# Patient Record
Sex: Female | Born: 1985 | Race: White | Hispanic: No | Marital: Single | Smoking: Never smoker
Health system: Southern US, Community
[De-identification: ages and names within clinical notes are randomized; demographics above are authoritative.]

## PROBLEM LIST (undated history)

## (undated) DIAGNOSIS — R87629 Unspecified abnormal cytological findings in specimens from vagina: Secondary | ICD-10-CM

## (undated) HISTORY — DX: Unspecified abnormal cytological findings in specimens from vagina: R87.629

---
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
## 2006-05-04 ENCOUNTER — Ambulatory Visit (HOSPITAL_COMMUNITY): Admission: RE | Admit: 2006-05-04 | Discharge: 2006-05-04 | Payer: Self-pay | Admitting: Obstetrics and Gynecology

## 2006-05-30 ENCOUNTER — Inpatient Hospital Stay (HOSPITAL_COMMUNITY): Admission: AD | Admit: 2006-05-30 | Discharge: 2006-06-01 | Payer: Self-pay | Admitting: Obstetrics and Gynecology

## 2006-07-11 ENCOUNTER — Other Ambulatory Visit: Admission: RE | Admit: 2006-07-11 | Discharge: 2006-07-11 | Payer: Self-pay | Admitting: Obstetrics and Gynecology

## 2008-07-26 ENCOUNTER — Emergency Department: Payer: Self-pay | Admitting: Emergency Medicine
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
## 2013-01-11 ENCOUNTER — Encounter: Payer: Self-pay | Admitting: Obstetrics and Gynecology

## 2013-01-21 ENCOUNTER — Encounter: Payer: Self-pay | Admitting: *Deleted

## 2016-02-02 ENCOUNTER — Encounter (HOSPITAL_COMMUNITY): Payer: Self-pay | Admitting: *Deleted

## 2016-02-11 ENCOUNTER — Ambulatory Visit (HOSPITAL_COMMUNITY)
Admission: RE | Admit: 2016-02-11 | Discharge: 2016-02-11 | Disposition: A | Discharge status: Home / Self Care | Payer: Medicaid Other | Source: Ambulatory Visit | Attending: Obstetrics and Gynecology | Admitting: Obstetrics and Gynecology

## 2016-02-11 ENCOUNTER — Encounter (HOSPITAL_COMMUNITY): Payer: Self-pay

## 2016-02-11 VITALS — BP 112/60 | Temp 98.6°F | Ht 66.0 in | Wt 146.8 lb

## 2016-02-11 DIAGNOSIS — R87613 High grade squamous intraepithelial lesion on cytologic smear of cervix (HGSIL): Secondary | ICD-10-CM | POA: Diagnosis present

## 2016-02-11 DIAGNOSIS — Z1239 Encounter for other screening for malignant neoplasm of breast: Secondary | ICD-10-CM

## 2016-02-11 NOTE — Progress Notes (Signed)
[facility] due to recommending a colposcopy for an abnormal Pap smear on 12/30/2015.  Pap Smear: Pap smear not completed today. [facility]. [facility] for a [facility]ps[facility]. Appointment scheduled for Thursday, February 18, 2016 at 1420. Per patient has a history of three or four other abnormal Pap smears that did not have follow up completed. Last Pap smear result is in [facility].  Physical exam: Breasts Breasts symmetrical. No skin abnormalities bilateral breasts. No nipple retraction bilateral breasts. No nipple discharge bilateral breasts. No lymphadenopathy. No lumps palpated bilateral breasts. No complaints of pain or tenderness on exam. Screening mammogram recommended at age 940 unless clinically indicated prior.    Pelvic/Bimanual No Pap smear completed today since last Pap smear was 12/30/2015. Pap smear not indicated per [facility] guidelines.   Smoking History: Patient has never smoked.  Patient Navigation: Patient education provided. Access to services provided for [facility] through [facility].

## 2016-02-11 NOTE — Patient Instructions (Signed)
Explained breast self awareness to Alisha Benton. Patient did not need a Pap smear today due to last Pap smear was 12/30/2015. Explained the colposcopy and the importance of follow up. [facility]. Appointment scheduled for Thursday, February 18, 2016 at 1420. Patient aware of appointment and will be there. Informed patient that a screening mammogram is recommended at age 30 unless clinically indicated prior. Alisha Benton verbalized understanding.  [place], Kathaleen Maserhristine Poll, RN 4:50 PM

## 2016-02-15 ENCOUNTER — Encounter (HOSPITAL_COMMUNITY): Payer: Self-pay | Admitting: *Deleted

## 2016-02-18 ENCOUNTER — Other Ambulatory Visit (HOSPITAL_COMMUNITY)
Admission: RE | Admit: 2016-02-18 | Discharge: 2016-02-18 | Disposition: A | Discharge status: Home / Self Care | Payer: Medicaid Other | Source: Ambulatory Visit | Attending: Obstetrics and Gynecology | Admitting: Obstetrics and Gynecology

## 2016-02-18 ENCOUNTER — Ambulatory Visit (INDEPENDENT_AMBULATORY_CARE_PROVIDER_SITE_OTHER): Payer: Self-pay | Admitting: Obstetrics and Gynecology

## 2016-02-18 ENCOUNTER — Encounter: Payer: Self-pay | Admitting: Obstetrics and Gynecology

## 2016-02-18 VITALS — BP 129/93 | HR 95 | Wt 145.0 lb

## 2016-02-18 DIAGNOSIS — N871 Moderate cervical dysplasia: Secondary | ICD-10-CM | POA: Insufficient documentation

## 2016-02-18 DIAGNOSIS — Z3202 Encounter for pregnancy test, result negative: Secondary | ICD-10-CM

## 2016-02-18 LAB — POCT PREGNANCY, URINE: Preg Test, Ur: NEGATIVE

## 2016-02-18 NOTE — Procedures (Signed)
Colposcopy Procedure Note  30 y/o G2P2 (LMP: 8/29) [facility]. PMHx significant for h/o abnormal paps x 4-5, per patient but didn't want to get colposcopies due to anxiety.  PMHx, SurgHx: negative OBGYN: SVD x 2. No h/o STIs. On OCPs x 1-2 years. No h/o HPV vaccine series FHx: no h/o female cancers Meds: combined OCP NKA Social: no tobacco, drug use. Engaged. 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   The risks (including infection, bleeding, pain) and benefits of the procedure were explained to the patient and written informed consent was obtained.  The patient was placed in the dorsal lithotomy position. A Graves was speculum inserted in the vagina, and the cervix was visualized.  AA staining done Lugol's with green filter.  Biopsy done from 6-7 o'clock and then single toothed tenaculum applied and ECC in all four quadrants done. No bleeding after procedure after application of monsels'  Findings: AWE changes on AA staining with right >left. Cervix with indentation at 9 o'clock but no mosaicism, abnormal vasculature, friability or punctuation.   Adequate: yes  Specimens: 6 o'clock bx and ECC  Condition: Stable  Complications: None  Plan: The patient was advised to call for any fever or for prolonged or severe pain or bleeding. She was advised to use OTC analgesics as needed for mild to moderate pain. She was advised to avoid vaginal intercourse for 48 hours or until the bleeding has completely stopped.  Concerning that colpo is discordant with HSIL and worry endocervical disease.   Cornelia Copaharlie Welles Walthall, Jr MD Attending Center [facility](Faculty Practice)

## 2016-02-23 ENCOUNTER — Telehealth: Payer: Self-pay | Admitting: Obstetrics and Gynecology

## 2016-02-23 NOTE — Telephone Encounter (Signed)
GYN Telephone Note  02/23/2016 @ 1223pm Patient called at [phone] and generic VM picked up. VM left to have patient call the office to go over results. Will try again later this week  Alisha Benton, Jr MD Attending [facility](Faculty Practice)

## 2016-02-24 ENCOUNTER — Telehealth: Payer: Self-pay | Admitting: Obstetrics and Gynecology

## 2016-02-24 NOTE — Telephone Encounter (Signed)
GYN Telephone Note  Patient called and told about recommendations for LEEP which she is amenable. R/b of it, namely risk of PTB/PTL with procedure but given her history and pathology, I'd still recommend it. Pt getting married in mid oct and I told her it's fine to wait until late oct/early nov. Also told about pre-meds which I will leave at the front desk when we have a leep date set. All questions asked and answered.  Cornelia Copaharlie Nathania Waldman, Jr MD Attending [facility]ulty [facility])

## 2016-02-25 ENCOUNTER — Encounter: Payer: Self-pay | Admitting: Obstetrics and Gynecology

## 2016-03-29 ENCOUNTER — Other Ambulatory Visit: Payer: Self-pay | Admitting: Obstetrics and Gynecology

## 2016-03-29 MED ORDER — OXYCODONE-ACETAMINOPHEN 10-325 MG PO TABS: ORAL_TABLET | ORAL | 0 refills | Status: AC

## 2016-03-29 MED ORDER — LORAZEPAM 2 MG PO TABS: ORAL_TABLET | ORAL | 0 refills | Status: AC

## 2016-03-29 MED ORDER — IBUPROFEN 200 MG PO TABS: ORAL_TABLET | ORAL | 1 refills | Status: AC

## 2016-03-29 NOTE — Progress Notes (Signed)
LEEP pre meds Rx left at front desk for patient: percocet 10/325 #1, ativan 2mg  #1 and motrin Rx sent in to pharmacy. Patient instructed to bring these with her to the appointment and not take them before getting to appointment.   Alisha Benton, Jr MD Attending [facility]ulty [facility])

## 2016-03-30 ENCOUNTER — Other Ambulatory Visit (HOSPITAL_COMMUNITY)
Admission: RE | Admit: 2016-03-30 | Discharge: 2016-03-30 | Disposition: A | Discharge status: Home / Self Care | Payer: Medicaid Other | Source: Ambulatory Visit | Attending: Obstetrics and Gynecology | Admitting: Obstetrics and Gynecology

## 2016-03-30 ENCOUNTER — Ambulatory Visit (INDEPENDENT_AMBULATORY_CARE_PROVIDER_SITE_OTHER): Payer: No Typology Code available for payment source | Admitting: Obstetrics and Gynecology

## 2016-03-30 ENCOUNTER — Encounter: Payer: Self-pay | Admitting: Obstetrics and Gynecology

## 2016-03-30 VITALS — BP 122/89 | HR 75 | Wt 146.4 lb

## 2016-03-30 DIAGNOSIS — N871 Moderate cervical dysplasia: Secondary | ICD-10-CM | POA: Insufficient documentation

## 2016-03-30 NOTE — Procedures (Signed)
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 Diffuse AWE changes with increase mosaicism on the upper half of the cervix. Confirmatory with Lugols and green filter for both viewing solutions.   PROCEDURE IN DETAIL:  After informed consent and an negative UPT were obtained, the patient was positioned in the dorsal lithotomy position.The coated bi-valved speculum was placed inside the patient's vagina and acetic acid and Lugol's applied with the above noted findings with repeat colposcopy. The anterior lip of the cervix was then grasped with a tenaculum, and lidocaine with epinephrine was injected into the cervix.  The tenaculum was removed and using a large, shallow fischer loop on 44 blend, the specimen was excised. At 9 o'clock, there was a slight puckering/dimple of the cervix prior to the procedure and I wasn't sure if I completely excised the ectocervix. Using a top hat LEEP electrode, a specimen was removed from 6 to 10 o'clock that encompassed the untouched ectocervix and the LEEP bed.  A single toothed tenaculum as then applied and four quadrant ECC was done. The tenaculum was removed and using a ball electrode on 50 coag, the LEEP bed and edges were cauterized and monsel's applied to the bed for excellent hemostasis.   The patient tolerated the procedure well.  Cornelia Copaharlie Wenceslaus Gist, Jr MD Attending [facility]ulty Practice)

## 2016-03-31 ENCOUNTER — Telehealth: Payer: Self-pay | Admitting: Obstetrics and Gynecology

## 2016-03-31 LAB — POCT PREGNANCY, URINE: Preg Test, Ur: NEGATIVE

## 2016-03-31 NOTE — Telephone Encounter (Signed)
GYN Telephone Note 03/31/2016 Time: 1603  Results d/w her and told her that I'm happy with the results even with a focally +spot at, "the endocervical margin in sections corresponding to 12-3-6:00."  I told her that the margins really could be negative but you can't 100% tell due to the burn artifact but I'm happy with the results, especially with a negative ECC, which was + on the colpo; also I burned the LEEP bed after the procedure which could also get rid of any residual abnormal cells too    I told her that I'd recommend [id number] cytology only paps and ECC x 2 if they are both negative and then qyr cyto and HPV testing for the next few years. I told her that close surveillance is needed, even if the margins had definitely been negative given her high grade disease and +endocervical gland involvement.   Will see her back in a few weeks for a post op check and d/w pt more then. All questions, asked and answered.  Cornelia Copaharlie Desiree Fleming, Jr MD Attending [facility]ulty Practice)

## 2016-04-27 ENCOUNTER — Ambulatory Visit: Payer: Self-pay | Admitting: Obstetrics and Gynecology

## 2016-04-28 ENCOUNTER — Encounter: Payer: Self-pay | Admitting: Obstetrics and Gynecology

## 2016-04-28 NOTE — Progress Notes (Signed)
Patient did not keep GYN post op appointment for 04/27/2016.  Alisha Benton, Jr MD Attending [facility])

## 2016-05-09 ENCOUNTER — Encounter: Payer: Self-pay | Admitting: Obstetrics and Gynecology

## 2022-12-12 ENCOUNTER — Encounter: Payer: Self-pay | Admitting: Plastic Surgery

## 2023-01-02 ENCOUNTER — Institutional Professional Consult (permissible substitution): Payer: Self-pay | Admitting: Plastic Surgery
